# Patient Record
Sex: Male | Born: 1990 | Race: Black or African American | Hispanic: No | Marital: Single | Smoking: Never smoker
Health system: Southern US, Community
[De-identification: ages and names within clinical notes are randomized; demographics above are authoritative.]

## PROBLEM LIST (undated history)

## (undated) ENCOUNTER — Emergency Department (HOSPITAL_COMMUNITY): Admission: EM | Payer: PRIVATE HEALTH INSURANCE | Source: Home / Self Care

## (undated) DIAGNOSIS — M25311 Other instability, right shoulder: Secondary | ICD-10-CM

---
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
## 2013-12-07 DIAGNOSIS — M25311 Other instability, right shoulder: Secondary | ICD-10-CM

## 2013-12-07 HISTORY — DX: Other instability, right shoulder: M25.311

## 2013-12-17 ENCOUNTER — Other Ambulatory Visit: Payer: Self-pay | Admitting: Orthopedic Surgery

## 2013-12-17 ENCOUNTER — Encounter (HOSPITAL_BASED_OUTPATIENT_CLINIC_OR_DEPARTMENT_OTHER): Payer: Self-pay | Admitting: *Deleted

## 2013-12-19 ENCOUNTER — Ambulatory Visit (HOSPITAL_BASED_OUTPATIENT_CLINIC_OR_DEPARTMENT_OTHER)
Admission: RE | Admit: 2013-12-19 | Discharge: 2013-12-19 | Disposition: A | Discharge status: Home / Self Care | Payer: BC Managed Care – PPO | Source: Ambulatory Visit | Attending: Orthopedic Surgery | Admitting: Orthopedic Surgery

## 2013-12-19 ENCOUNTER — Encounter (HOSPITAL_BASED_OUTPATIENT_CLINIC_OR_DEPARTMENT_OTHER): Payer: BC Managed Care – PPO | Admitting: Certified Registered"

## 2013-12-19 ENCOUNTER — Encounter (HOSPITAL_BASED_OUTPATIENT_CLINIC_OR_DEPARTMENT_OTHER): Payer: Self-pay | Admitting: *Deleted

## 2013-12-19 ENCOUNTER — Ambulatory Visit (HOSPITAL_BASED_OUTPATIENT_CLINIC_OR_DEPARTMENT_OTHER): Payer: BC Managed Care – PPO | Admitting: Certified Registered"

## 2013-12-19 ENCOUNTER — Ambulatory Visit (HOSPITAL_COMMUNITY): Payer: BC Managed Care – PPO

## 2013-12-19 ENCOUNTER — Encounter (HOSPITAL_BASED_OUTPATIENT_CLINIC_OR_DEPARTMENT_OTHER)
Admission: RE | Disposition: A | Discharge status: Home / Self Care | Payer: Self-pay | Source: Ambulatory Visit | Attending: Orthopedic Surgery

## 2013-12-19 DIAGNOSIS — M24419 Recurrent dislocation, unspecified shoulder: Secondary | ICD-10-CM | POA: Insufficient documentation

## 2013-12-19 DIAGNOSIS — M25311 Other instability, right shoulder: Secondary | ICD-10-CM

## 2013-12-19 DIAGNOSIS — M24819 Other specific joint derangements of unspecified shoulder, not elsewhere classified: Secondary | ICD-10-CM | POA: Diagnosis present

## 2013-12-19 HISTORY — DX: Other instability, right shoulder: M25.311

## 2013-12-19 LAB — POCT HEMOGLOBIN-HEMACUE: HEMOGLOBIN: 16.3 g/dL (ref 13.0–17.0)

## 2013-12-19 SURGERY — REPAIR, SHOULDER, LATARJET
Anesthesia: General | Site: Shoulder | Laterality: Right

## 2013-12-19 MED ORDER — FENTANYL CITRATE 0.05 MG/ML IJ SOLN
INTRAMUSCULAR | Status: DC | PRN
  Administered 2013-12-19: 50 ug via INTRAVENOUS
  Administered 2013-12-19: 100 ug via INTRAVENOUS

## 2013-12-19 MED ORDER — FENTANYL CITRATE 0.05 MG/ML IJ SOLN
INTRAMUSCULAR | Status: AC
  Filled 2013-12-19: qty 2

## 2013-12-19 MED ORDER — HYDROMORPHONE HCL PF 1 MG/ML IJ SOLN
INTRAMUSCULAR | Status: AC
  Filled 2013-12-19: qty 1

## 2013-12-19 MED ORDER — CEFAZOLIN SODIUM-DEXTROSE 2-3 GM-% IV SOLR
INTRAVENOUS | Status: AC
  Filled 2013-12-19: qty 50

## 2013-12-19 MED ORDER — PROMETHAZINE HCL 25 MG/ML IJ SOLN: 6.2500 mg | INTRAMUSCULAR | Status: DC | PRN

## 2013-12-19 MED ORDER — BUPIVACAINE-EPINEPHRINE (PF) 0.5% -1:200000 IJ SOLN
INTRAMUSCULAR | Status: DC | PRN
  Administered 2013-12-19: 30 mL via PERINEURAL

## 2013-12-19 MED ORDER — MIDAZOLAM HCL 2 MG/ML PO SYRP: 12.0000 mg | ORAL_SOLUTION | Freq: Once | ORAL | Status: DC | PRN

## 2013-12-19 MED ORDER — MIDAZOLAM HCL 5 MG/5ML IJ SOLN
INTRAMUSCULAR | Status: DC | PRN
  Administered 2013-12-19: 2 mg via INTRAVENOUS

## 2013-12-19 MED ORDER — DOCUSATE SODIUM 100 MG PO CAPS: 100.0000 mg | ORAL_CAPSULE | Freq: Three times a day (TID) | ORAL | Status: AC | PRN

## 2013-12-19 MED ORDER — SODIUM CHLORIDE 0.9 % IR SOLN
Status: DC | PRN
  Administered 2013-12-19: 1

## 2013-12-19 MED ORDER — OXYCODONE HCL 5 MG PO TABS
ORAL_TABLET | ORAL | Status: AC
  Filled 2013-12-19: qty 1

## 2013-12-19 MED ORDER — MEPERIDINE HCL 25 MG/ML IJ SOLN: 6.2500 mg | INTRAMUSCULAR | Status: DC | PRN

## 2013-12-19 MED ORDER — POVIDONE-IODINE 7.5 % EX SOLN: Freq: Once | CUTANEOUS | Status: DC

## 2013-12-19 MED ORDER — MIDAZOLAM HCL 2 MG/2ML IJ SOLN
1.0000 mg | INTRAMUSCULAR | Status: DC | PRN
  Administered 2013-12-19: 2 mg via INTRAVENOUS

## 2013-12-19 MED ORDER — LACTATED RINGERS IV SOLN
INTRAVENOUS | Status: DC
  Administered 2013-12-19 (×2): via INTRAVENOUS

## 2013-12-19 MED ORDER — ONDANSETRON HCL 4 MG/2ML IJ SOLN
INTRAMUSCULAR | Status: DC | PRN
  Administered 2013-12-19: 4 mg via INTRAVENOUS

## 2013-12-19 MED ORDER — HYDROMORPHONE HCL PF 1 MG/ML IJ SOLN
0.2500 mg | INTRAMUSCULAR | Status: DC | PRN
  Administered 2013-12-19 (×3): 0.25 mg via INTRAVENOUS

## 2013-12-19 MED ORDER — MIDAZOLAM HCL 2 MG/2ML IJ SOLN: 0.5000 mg | Freq: Once | INTRAMUSCULAR | Status: DC | PRN

## 2013-12-19 MED ORDER — OXYCODONE HCL 5 MG PO TABS
5.0000 mg | ORAL_TABLET | Freq: Once | ORAL | Status: AC | PRN
  Administered 2013-12-19: 5 mg via ORAL

## 2013-12-19 MED ORDER — MIDAZOLAM HCL 2 MG/2ML IJ SOLN
INTRAMUSCULAR | Status: AC
  Filled 2013-12-19: qty 2

## 2013-12-19 MED ORDER — CEFAZOLIN SODIUM-DEXTROSE 2-3 GM-% IV SOLR
2.0000 g | INTRAVENOUS | Status: AC
  Administered 2013-12-19: 2 g via INTRAVENOUS

## 2013-12-19 MED ORDER — FENTANYL CITRATE 0.05 MG/ML IJ SOLN
INTRAMUSCULAR | Status: AC
  Filled 2013-12-19: qty 6

## 2013-12-19 MED ORDER — LIDOCAINE HCL 4 % MT SOLN
OROMUCOSAL | Status: DC | PRN
  Administered 2013-12-19: 4 mL via TOPICAL

## 2013-12-19 MED ORDER — SUCCINYLCHOLINE CHLORIDE 20 MG/ML IJ SOLN
INTRAMUSCULAR | Status: DC | PRN
  Administered 2013-12-19: 100 mg via INTRAVENOUS

## 2013-12-19 MED ORDER — FENTANYL CITRATE 0.05 MG/ML IJ SOLN
50.0000 ug | INTRAMUSCULAR | Status: DC | PRN
  Administered 2013-12-19: 100 ug via INTRAVENOUS

## 2013-12-19 MED ORDER — OXYCODONE HCL 5 MG/5ML PO SOLN: 5.0000 mg | Freq: Once | ORAL | Status: AC | PRN

## 2013-12-19 MED ORDER — DEXAMETHASONE SODIUM PHOSPHATE 4 MG/ML IJ SOLN
INTRAMUSCULAR | Status: DC | PRN
  Administered 2013-12-19: 10 mg via INTRAVENOUS

## 2013-12-19 MED ORDER — OXYCODONE-ACETAMINOPHEN 5-325 MG PO TABS: 1.0000 | ORAL_TABLET | ORAL | Status: AC | PRN

## 2013-12-19 MED ORDER — PROPOFOL 10 MG/ML IV BOLUS
INTRAVENOUS | Status: DC | PRN
  Administered 2013-12-19: 200 mg via INTRAVENOUS

## 2013-12-19 SURGICAL SUPPLY — 113 items
BENZOIN TINCTURE PRP APPL 2/3 (GAUZE/BANDAGES/DRESSINGS) IMPLANT
BIT DRILL 2.75 .066 CANNLTION (DRILL) ×2 IMPLANT
BIT DRILL NON CANNULATED 4MM (DRILL) ×2 IMPLANT
BLADE AVERAGE 25MMX9MM (BLADE)
BLADE AVERAGE 25X9 (BLADE) IMPLANT
BLADE CLIPPER SURG (BLADE) IMPLANT
BLADE SAW SAG 19X10X0.6 (BLADE) ×3 IMPLANT
BLADE SAW SAG 19X10X0.6MM (BLADE) ×1
BLADE SURG 10 STRL SS (BLADE) ×4 IMPLANT
BLADE SURG 15 STRL LF DISP TIS (BLADE) ×2 IMPLANT
BLADE SURG 15 STRL SS (BLADE) ×2
BUR EGG 3PK/BX (BURR) ×4 IMPLANT
BUR EGG ELITE 4.0 (BURR) IMPLANT
BUR EGG ELITE 4.0MM (BURR)
BUR OVAL 4.0 (BURR) IMPLANT
CANISTER SUCT 3000ML (MISCELLANEOUS) IMPLANT
CANNULA 5.75X71 LONG (CANNULA) IMPLANT
CANNULA TWIST IN 8.25X7CM (CANNULA) IMPLANT
CHLORAPREP W/TINT 26ML (MISCELLANEOUS) ×4 IMPLANT
CLOSURE WOUND 1/2 X4 (GAUZE/BANDAGES/DRESSINGS) ×1
DECANTER SPIKE VIAL GLASS SM (MISCELLANEOUS) IMPLANT
DERMABOND ADVANCED (GAUZE/BANDAGES/DRESSINGS)
DERMABOND ADVANCED .7 DNX12 (GAUZE/BANDAGES/DRESSINGS) IMPLANT
DRAPE C-ARM 42X72 X-RAY (DRAPES) IMPLANT
DRAPE INCISE IOBAN 66X45 STRL (DRAPES) ×4 IMPLANT
DRAPE STERI 35X30 U-POUCH (DRAPES) IMPLANT
DRAPE SURG 17X23 STRL (DRAPES) ×8 IMPLANT
DRAPE U 20/CS (DRAPES) ×4 IMPLANT
DRAPE U-SHAPE 47X51 STRL (DRAPES) ×4 IMPLANT
DRAPE U-SHAPE 76X120 STRL (DRAPES) ×8 IMPLANT
DRILL 2.75 .066 CANNULATION (DRILL) ×4
DRILL NON CANNULATED 4MM (DRILL) ×4
DRSG PAD ABDOMINAL 8X10 ST (GAUZE/BANDAGES/DRESSINGS) IMPLANT
DRSG TEGADERM 4X4.75 (GAUZE/BANDAGES/DRESSINGS) ×8 IMPLANT
ELECT BLADE 6.5 .24CM SHAFT (ELECTRODE) ×4 IMPLANT
ELECT NEEDLE TIP 2.8 STRL (NEEDLE) ×4 IMPLANT
ELECT REM PT RETURN 9FT ADLT (ELECTROSURGICAL) ×4
ELECTRODE REM PT RTRN 9FT ADLT (ELECTROSURGICAL) ×2 IMPLANT
GAUZE SPONGE 4X4 12PLY STRL (GAUZE/BANDAGES/DRESSINGS) ×4 IMPLANT
GAUZE SPONGE 4X4 16PLY XRAY LF (GAUZE/BANDAGES/DRESSINGS) IMPLANT
GAUZE XEROFORM 1X8 LF (GAUZE/BANDAGES/DRESSINGS) ×4 IMPLANT
GLOVE BIO SURGEON STRL SZ7 (GLOVE) ×4 IMPLANT
GLOVE BIO SURGEON STRL SZ7.5 (GLOVE) ×4 IMPLANT
GLOVE BIOGEL PI IND STRL 7.0 (GLOVE) ×4 IMPLANT
GLOVE BIOGEL PI IND STRL 8 (GLOVE) ×2 IMPLANT
GLOVE BIOGEL PI INDICATOR 7.0 (GLOVE) ×4
GLOVE BIOGEL PI INDICATOR 8 (GLOVE) ×2
GLOVE ECLIPSE 6.5 STRL STRAW (GLOVE) ×4 IMPLANT
GLOVE EXAM NITRILE MD LF STRL (GLOVE) ×4 IMPLANT
GOWN STRL REUS W/ TWL LRG LVL3 (GOWN DISPOSABLE) ×4 IMPLANT
GOWN STRL REUS W/TWL LRG LVL3 (GOWN DISPOSABLE) ×4
GOWN STRL REUS W/TWL XL LVL4 (GOWN DISPOSABLE) ×4 IMPLANT
GUIDEWIRE .062X7IN LONG (WIRE) ×8 IMPLANT
KIT BIO-TENODESIS 3X8 DISP (MISCELLANEOUS)
KIT INSRT BABSR STRL DISP BTN (MISCELLANEOUS) IMPLANT
MANIFOLD NEPTUNE II (INSTRUMENTS) IMPLANT
NDL SUT 6 .5 CRC .975X.05 MAYO (NEEDLE) IMPLANT
NEEDLE 1/2 CIR CATGUT .05X1.09 (NEEDLE) IMPLANT
NEEDLE MAYO TAPER (NEEDLE)
NEEDLE SCORPION MULTI FIRE (NEEDLE) IMPLANT
NS IRRIG 1000ML POUR BTL (IV SOLUTION) IMPLANT
PACK ARTHROSCOPY DSU (CUSTOM PROCEDURE TRAY) ×4 IMPLANT
PACK BASIN DAY SURGERY FS (CUSTOM PROCEDURE TRAY) ×4 IMPLANT
PASSER SUT SWANSON 36MM LOOP (INSTRUMENTS) IMPLANT
PENCIL BUTTON HOLSTER BLD 10FT (ELECTRODE) ×4 IMPLANT
PIN STEINMAN 7/64X9 TRO PT NS (PIN) ×8 IMPLANT
RESECTOR FULL RADIUS 4.2MM (BLADE) IMPLANT
RETRIEVER SUT HEWSON (MISCELLANEOUS) IMPLANT
SCREW CANNULATED 3.75X36MM FT (Screw) ×8 IMPLANT
SHEET MEDIUM DRAPE 40X70 STRL (DRAPES) IMPLANT
SLEEVE SCD COMPRESS KNEE MED (MISCELLANEOUS) ×4 IMPLANT
SLING ARM IMMOBILIZER LRG (SOFTGOODS) ×4 IMPLANT
SLING ARM IMMOBILIZER MED (SOFTGOODS) IMPLANT
SLING ARM LRG ADULT FOAM STRAP (SOFTGOODS) IMPLANT
SLING ARM MED ADULT FOAM STRAP (SOFTGOODS) IMPLANT
SLING ARM XL FOAM STRAP (SOFTGOODS) IMPLANT
SPONGE LAP 18X18 X RAY DECT (DISPOSABLE) ×4 IMPLANT
SPONGE LAP 4X18 X RAY DECT (DISPOSABLE) ×8 IMPLANT
STRIP CLOSURE SKIN 1/2X4 (GAUZE/BANDAGES/DRESSINGS) ×3 IMPLANT
SUCTION FRAZIER TIP 10 FR DISP (SUCTIONS) ×4 IMPLANT
SUPPORT WRAP ARM LG (MISCELLANEOUS) ×4 IMPLANT
SUT 2 FIBERLOOP 20 STRT BLUE (SUTURE)
SUT BONE WAX W31G (SUTURE) IMPLANT
SUT ETHIBOND 2 OS 4 DA (SUTURE) IMPLANT
SUT ETHILON 3 0 PS 1 (SUTURE) IMPLANT
SUT ETHILON 4 0 PS 2 18 (SUTURE) IMPLANT
SUT FIBERWIRE #2 38 T-5 BLUE (SUTURE)
SUT FIBERWIRE 2-0 18 17.9 3/8 (SUTURE)
SUT MNCRL AB 3-0 PS2 18 (SUTURE) IMPLANT
SUT MNCRL AB 4-0 PS2 18 (SUTURE) ×4 IMPLANT
SUT PDS 2 CP NEEDLE XSPECIAL (SUTURE) IMPLANT
SUT PDS AB 0 CT 36 (SUTURE) IMPLANT
SUT PROLENE 3 0 PS 2 (SUTURE) IMPLANT
SUT TIGER TAPE 7 IN WHITE (SUTURE) IMPLANT
SUT VIC AB 0 CT1 27 (SUTURE)
SUT VIC AB 0 CT1 27XBRD ANBCTR (SUTURE) IMPLANT
SUT VIC AB 1 CT1 27 (SUTURE) ×2
SUT VIC AB 1 CT1 27XBRD ANBCTR (SUTURE) ×2 IMPLANT
SUT VIC AB 2-0 SH 27 (SUTURE) ×2
SUT VIC AB 2-0 SH 27XBRD (SUTURE) ×2 IMPLANT
SUTURE 2 FIBERLOOP 20 STRT BLU (SUTURE) IMPLANT
SUTURE FIBERWR #2 38 T-5 BLUE (SUTURE) IMPLANT
SUTURE FIBERWR 2-0 18 17.9 3/8 (SUTURE) IMPLANT
SYR BULB 3OZ (MISCELLANEOUS) ×4 IMPLANT
TAPE FIBER 2MM 7IN #2 BLUE (SUTURE) IMPLANT
TOWEL OR 17X24 6PK STRL BLUE (TOWEL DISPOSABLE) ×8 IMPLANT
TOWEL OR NON WOVEN STRL DISP B (DISPOSABLE) IMPLANT
TUBE CONNECTING 20'X1/4 (TUBING)
TUBE CONNECTING 20X1/4 (TUBING) IMPLANT
TUBING ARTHROSCOPY IRRIG 16FT (MISCELLANEOUS) IMPLANT
WAND STAR VAC 90 (SURGICAL WAND) IMPLANT
WATER STERILE IRR 1000ML POUR (IV SOLUTION) IMPLANT
YANKAUER SUCT BULB TIP NO VENT (SUCTIONS) ×4 IMPLANT

## 2013-12-19 NOTE — Anesthesia Postprocedure Evaluation (Signed)
  Anesthesia Post-op Note  Patient: Roberto Freeman  Procedure(s) Performed: Procedure(s): Right Shoulder Coracoid Transfer (Right)  Patient Location: PACU  Anesthesia Type:GA combined with regional for post-op pain  Level of Consciousness: awake and alert   Airway and Oxygen Therapy: Patient Spontanous Breathing  Post-op Pain: mild  Post-op Assessment: Post-op Vital signs reviewed  Post-op Vital Signs: stable  Last Vitals:  Filed Vitals:   12/19/13 1737  BP:   Pulse: 73  Temp:   Resp: 16    Complications: No apparent anesthesia complications

## 2013-12-19 NOTE — Transfer of Care (Signed)
Immediate Anesthesia Transfer of Care Note  Patient: Roberto Freeman  Procedure(s) Performed: Procedure(s): Right Shoulder Coracoid Transfer (Right)  Patient Location: PACU  Anesthesia Type:GA combined with regional for post-op pain  Level of Consciousness: sedated and patient cooperative  Airway & Oxygen Therapy: Patient Spontanous Breathing and Patient connected to face mask oxygen  Post-op Assessment: Report given to PACU RN and Post -op Vital signs reviewed and stable  Post vital signs: Reviewed and stable  Complications: No apparent anesthesia complications

## 2013-12-19 NOTE — Op Note (Signed)
Procedure(s): Right Shoulder Coracoid Transfer Procedure Note  Roberto Freeman [name] 23 y.o. 12/19/2013  Procedure(s) and Anesthesia Type:    * Right Shoulder Coracoid Transfer - General  Surgeon(s) and Role:    * Mable ParisJustin William Kerie Badger, MD - Primary   Indications:  23 y.o. 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 He was unable to return to desired level of activity was poor secondary to recurrent dislocations and is indicated for open surgery to try and stabilize his shoulder and prevent recurrent dislocations.   Surgeon: Mable ParisHANDLER,Shanekia Latella WILLIAM   Assistants: Damita Lackanielle Lalibert PA-C [facility](Danielle was present and scrubbed throughout the procedure and was essential in positioning, retraction, exposure, and closure)  Anesthesia: General endotracheal anesthesia with preoperative interscalene block given by the attending anesthesiologist    Procedure Detail  Right Shoulder Coracoid Transfer  Findings: The anterior labral tissue was completely deficient. The coracoid process was transferred to the anterior glenoid using 2 3.75 cannulated screws with excellent fixation.   Estimated Blood Loss:  less than 100 mL         Drains: 1 medium hemovac  Blood Given: none          Specimens: none        Complications:  * No complications entered in OR log *         Disposition: PACU - hemodynamically stable.         Condition: stable    Procedure:   The patient was identified in the preoperative holding area where I personally marked the operative extremity after verifying with the patient and consent. He  was taken to the operating room where He was transferred to the   operative table.  The patient received an interscalene block in   the holding area by the attending anesthesiologist.  General anesthesia was induced   in the operating room without complication.  The patient  did receive IV  Ancef prior to the commencement of the procedure.  The patient was   placed in the beach-chair position with the back raised about 30   degrees.  The nonoperative extremity and head and neck were carefully   positioned and padded protecting against neurovascular compromise.  The   Right upper extremity was then prepped and draped in the standard sterile   fashion.    The appropriate operative time-out was performed with   Anesthesia, the perioperative staff, as well as myself and we all agreed   that the right side was the correct operative site.    An approximately 8 cm incision was made from the palpable coracoid tip distally towards the axilla. Dissection was carried down through subcutaneous tissues to the deltopectoral interval which was identified by the cephalic vein which was taken laterally with the deltoid. The pectoralis major was taken medially. The conjoined tendon was identified and the lateral border was carefully dissected. A retractor was placed over the dorsal aspect of the coracoid. The coracoacromial ligament was taken leaving about a centimeter still attached to the coracoid. The pectoralis minor was then divided medially exposing the medial aspect of the coracoid. A small elevator was used beneath the coracoid to expose this area and then protect the underlying structures while a 90 saw was used to cut the coracoid just anterior to the coracoclavicular ligaments. This provided a graft piece that was approximately 18 mm in length. The undersurface the graft was freed of soft tissue  and then decorticated with a saw. 2 4 mm holes were drilled about 10 mm apart from each other. The arm was then externally rotated and the subscapularis was split at the division of the upper two thirds and lower one third bluntly with curved Mayo scissors. The underlying capsule was identified. Retractor was placed medially on the glenoid neck. A Steinmann pin was placed in the superior  glenoid neck to retract the upper portion of the subscapularis. A blunt retractor was placed between the capsule and inferior subscapularis. The joint was then sharply entered at the joint line and a Fukuda retractor was placed to displace the humeral head lateral and posteriorly. The anterior soft tissue of the glenoid was resected with the Bovie and rongeur. A bur was then used to create a freshened bone surface for healing. A 2.75 mm hole was then drilled about 6 mm medial to the face of the glenoid inferiorly in the cannulated system was used to place a 36 mm 3.75 screw inferiorly. The offset of the graft was felt to be just right was not overhanging laterally. The superior screw was then placed with a 36 mm screw. Again the graft was felt to be in good position without overhanging. There is no penetration screw in the articular surface. A Fukuda retractor was removed and copious irrigation was used. The capsule was closed to the coracoacromial ligament on the graft with #1 Vicryl suture. The subscapularis was allowed to close over the graft and did not require any fixation. Again copious irrigation was used in the skin was closed with 2-0 Vicryl and 4-0 Monocryl Steri-Strips and a light dressing. Fluoroscopic imaging was used with a Grashey and axillary views to ensure appropriate positioning the screws and graft. Screws remain extra-articular the graft appeared to be in excellent position. Fluoroscopic imaging imaging also verified that a screw from one of the retractor instruments which could not initially be found was not in the operative site. Eventually the screw was found on the floor. The patient was allowed to awaken from anesthesia transferred to stretcher and taken to the recovery room in stable condition.       POSTOPERATIVE PLAN:  He will be discharged home today with family and will followup in my office in 10-14 days begin some passive range of motion exercises.

## 2013-12-19 NOTE — H&P (Signed)
Roberto Freeman is an 23 y.o. male.   Chief Complaint: R shoulder recurrent instability HPI: H/o R shoulder arthroscopic stabilization procedure with recurrent instability.  Past Medical History  Diagnosis Date  . 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 Shoulder arthroscopy with bankart repair Right 2011    History reviewed. No pertinent family history. Social History:  reports that he has never smoked. He has never used smokeless tobacco. He reports that he drinks alcohol. He reports that he does not use illicit drugs.  Allergies: No Known Allergies  No prescriptions prior to admission    Results for orders placed during the hospital encounter of 12/19/13 (from the past 48 hour(s))  POCT HEMOGLOBIN-HEMACUE     Status: None   Collection Time    12/19/13 12:24 PM      Result Value Ref Range   Hemoglobin 16.3  13.0 - 17.0 g/dL   No results found.  Review of Systems  All other systems reviewed and are negative.   Blood pressure 123/72, pulse 54, temperature 98.6 F (37 C), temperature source Oral, resp. rate 18, height 5\' 11"  (1.803 m), weight 80.74 kg (178 lb), SpO2 100.00%. Physical Exam  Constitutional: He is oriented to person, place, and time. He appears well-developed and well-nourished.  HENT:  Head: Atraumatic.  Eyes: EOM are normal.  Cardiovascular: Intact distal pulses.   Respiratory: Effort normal.  Musculoskeletal:  R shoulder pos apprehension. NVID.  Neurological: He is alert and oriented to person, place, and time.  Skin: Skin is warm and dry.  Psychiatric: He has a normal mood and affect.     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 Risks / benefits of surgery discussed Consent on chart  NPO for OR Preop antibiotics   Bona Hubbard WILLIAM 12/19/2013, 1:04 PM

## 2013-12-19 NOTE — Anesthesia Procedure Notes (Addendum)
Anesthesia Regional Block:  Interscalene brachial plexus block  Pre-Anesthetic Checklist: ,, timeout performed, Correct Patient, Correct Site, Correct Laterality, Correct Procedure, Correct Position, site marked, Risks and benefits discussed,  Surgical consent,  Pre-op evaluation,  At surgeon's request and post-op pain management  Laterality: Right and Upper  Prep: chloraprep       Needles:  Injection technique: Single-shot  Needle Type: Stimulator Needle - 40     Needle Length: 4cm 4 cm Needle Gauge: 22 and 22 G    Additional Needles:  Procedures: nerve stimulator Interscalene brachial plexus block  Nerve Stimulator or Paresthesia:  Response: forearm twitch, 0.4 mA, 0.1 ms,   Additional Responses:   Narrative:  Start time: 12/19/2013 1:04 PM End time: 12/19/2013 1:10 PM Injection made incrementally with aspirations every 5 mL.  Performed by: Personally  Anesthesiologist: Sandford Craze Jackson, MD  Additional Notes: Pt identified in Holding room.  Monitors applied. Working IV access confirmed. Sterile prep R neck.  #22ga PNS to forearm twitch at 0.7040mA threshold.  30cc 0.5% Bupivacaine with 1:200k epi injected incrementally after negative test dose.  Patient asymptomatic, VSS, no heme aspirated, tolerated well.  Sandford Craze Jackson, MD   Procedure Name: Intubation Date/Time: 12/19/2013 1:45 PM Performed by: Gar GibbonKEETON, Laquanda Bick S Pre-anesthesia Checklist: Patient identified, Emergency Drugs available, Suction available and Patient being monitored Patient Re-evaluated:Patient Re-evaluated prior to inductionOxygen Delivery Method: Circle System Utilized Preoxygenation: Pre-oxygenation with 100% oxygen Intubation Type: IV induction Ventilation: Mask ventilation without difficulty Laryngoscope Size: Mac and 3 Grade View: Grade II Tube type: Oral Tube size: 8.0 mm Number of attempts: 1 Airway Equipment and Method: stylet and oral airway Placement Confirmation: ETT inserted through vocal cords under  direct vision,  positive ETCO2 and breath sounds checked- equal and bilateral Secured at: 22 cm Tube secured with: Tape Dental Injury: Teeth and Oropharynx as per pre-operative assessment

## 2013-12-19 NOTE — Discharge Instructions (Signed)
Discharge Instructions after Open Shoulder Repair  A sling has been provided for you. Remain in your sling at all times. This includes sleeping in your sling.  Use ice on the shoulder intermittently over the first 48 hours after surgery.  Pain medicine has been prescribed for you.  Use your medicine liberally over the first 48 hours, and then you can begin to taper your use. You may take Extra Strength Tylenol or Tylenol only in place of the pain pills. DO NOT take ANY nonsteroidal anti-inflammatory pain medications: Advil, Motrin, Ibuprofen, Aleve, Naproxen or Naprosyn.  You may remove your dressing after two days  You may shower 5 days after surgery. The incisions CANNOT get wet prior to 5 days. Simply allow the water to wash over the site and then pat dry. Do not rub the incisions. Make sure your axilla (armpit) is completely dry after showering.  Take one aspirin, a day for 2 weeks after surgery, unless you have an aspirin sensitivity/ allergy or asthma.   Please call [phone] during normal business hours or [phone] after hours for any problems. Including the following:  - excessive redness of the incisions - drainage for more than 4 days - fever of more than 101.5 F  *Please note that pain medications will not be refilled after hours or on weekends.  Regional Anesthesia Blocks  1. Numbness or the inability to move the "blocked" extremity may last from 3-48 hours after placement. The length of time depends on the medication injected and your individual response to the medication. If the numbness is not going away after 48 hours, call your surgeon.  2. The extremity that is blocked will need to be protected until the numbness is gone and the  Strength has returned. Because you cannot feel it, you will need to take extra care to avoid injury. Because it may be weak, you may have difficulty moving it or using it. You may not know what position it is in without looking at it while the  block is in effect.  3. For blocks in the legs and feet, returning to weight bearing and walking needs to be done carefully. You will need to wait until the numbness is entirely gone and the strength has returned. You should be able to move your leg and foot normally before you try and bear weight or walk. You will need someone to be with you when you first try to ensure you do not fall and possibly risk injury.  4. Bruising and tenderness at the needle site are common side effects and will resolve in a few days.  5. Persistent numbness or new problems with movement should be communicated to the surgeon or the [facility] [phone])/ [facility] [phone]).   Post Anesthesia Home Care Instructions  Activity: Get plenty of rest for the remainder of the day. A responsible adult should stay with you for 24 hours following the procedure.  For the next 24 hours, DO NOT: -Drive a car -Advertising copywriterperate machinery -Drink alcoholic beverages -Take any medication unless instructed by your physician -Make any legal decisions or sign important papers.  Meals: Start with liquid foods such as gelatin or soup. Progress to regular foods as tolerated. Avoid greasy, spicy, heavy foods. If nausea and/or vomiting occur, drink only clear liquids until the nausea and/or vomiting subsides. Call your physician if vomiting continues.  Special Instructions/Symptoms: Your throat may feel dry or sore from the anesthesia or the breathing tube placed in  your throat during surgery. If this causes discomfort, gargle with warm salt water. The discomfort should disappear within 24 hours. ° °

## 2013-12-19 NOTE — Anesthesia Preprocedure Evaluation (Addendum)
Anesthesia Evaluation  Patient identified by MRN, date of birth, ID band Patient awake    Reviewed: Allergy & Precautions, H&P , NPO status , Patient's Chart, lab work & pertinent test results  History of Anesthesia Complications Negative for: history of anesthetic complications  Airway Mallampati: I TM Distance: >3 FB Neck ROM: Full    Dental  (+) Teeth Intact, Dental Advisory Given   Pulmonary neg pulmonary ROS,  breath sounds clear to auscultation  Pulmonary exam normal       Cardiovascular negative cardio ROS  Rhythm:Regular Rate:Normal     Neuro/Psych negative neurological ROS     GI/Hepatic negative GI ROS, Neg liver ROS,   Endo/Other    Renal/GU negative Renal ROS     Musculoskeletal   Abdominal   Peds  Hematology negative hematology ROS (+)   Anesthesia Other Findings   Reproductive/Obstetrics                           Anesthesia Physical Anesthesia Plan  ASA: I  Anesthesia Plan: General   Post-op Pain Management:    Induction: Intravenous  Airway Management Planned: Oral ETT  Additional Equipment:   Intra-op Plan:   Post-operative Plan: Extubation in OR  Informed Consent: I have reviewed the patients History and Physical, chart, labs and discussed the procedure including the risks, benefits and alternatives for the proposed anesthesia with the patient or authorized representative who has indicated his/her understanding and acceptance.   Dental advisory given  Plan Discussed with: CRNA and Surgeon  Anesthesia Plan Comments: (Plan routine monitors, GETA with interscalene block for post op analgesia)        Anesthesia Quick Evaluation

## 2013-12-19 NOTE — Progress Notes (Signed)
Assisted Dr. Jackson with right, interscalene  block. Side rails up, monitors on throughout procedure. See vital signs in flow sheet. Tolerated Procedure well. 

## 2015-08-20 ENCOUNTER — Encounter (HOSPITAL_COMMUNITY): Payer: Self-pay | Admitting: *Deleted

## 2015-08-20 ENCOUNTER — Ambulatory Visit (HOSPITAL_COMMUNITY)
Admission: EM | Admit: 2015-08-20 | Discharge: 2015-08-20 | Disposition: A | Discharge status: Home / Self Care | Payer: PRIVATE HEALTH INSURANCE | Attending: Family Medicine | Admitting: Family Medicine

## 2015-08-20 ENCOUNTER — Ambulatory Visit (INDEPENDENT_AMBULATORY_CARE_PROVIDER_SITE_OTHER): Payer: PRIVATE HEALTH INSURANCE

## 2015-08-20 DIAGNOSIS — IMO0001 Reserved for inherently not codable concepts without codable children: Secondary | ICD-10-CM

## 2015-08-20 DIAGNOSIS — S40021A Contusion of right upper arm, initial encounter: Secondary | ICD-10-CM | POA: Diagnosis not present

## 2015-08-20 DIAGNOSIS — S40011A Contusion of right shoulder, initial encounter: Secondary | ICD-10-CM

## 2015-08-20 MED ORDER — DICLOFENAC POTASSIUM 50 MG PO TABS: 50.0000 mg | ORAL_TABLET | Freq: Three times a day (TID) | ORAL | Status: AC

## 2015-08-20 NOTE — ED Notes (Signed)
Large  Arm  Sling  Applied  To  l  shoulder

## 2015-08-20 NOTE — Discharge Instructions (Signed)
Wear sling and use ice and medicine as needed. See your orthopedist for recheck.

## 2015-08-20 NOTE — ED Provider Notes (Signed)
CSN: [id number]     Arrival date & time 08/20/15  1634 History   None    Chief Complaint  Patient presents with  . Shoulder Injury   (Consider location/radiation/quality/duration/timing/severity/associated sxs/prior Treatment) Patient is a 25 y.o. male presenting with shoulder injury. The history is provided by the patient.  Shoulder Injury This is a new problem. The current episode started more than 2 days ago (injured striking another player with shoulder to pads during football game. h/o RCS x2.). The problem has been gradually worsening.    Past Medical History  Diagnosis Date  . 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 Shoulder arthroscopy with bankart repair Right 2011   History reviewed. No pertinent family history. Social History  Substance Use Topics  . Smoking status: Never Smoker   . Smokeless tobacco: Never Used  . Alcohol Use: Yes     Comment: occasionally    Review of Systems  Constitutional: Negative.   Musculoskeletal: Positive for joint swelling. Negative for myalgias, back pain, gait problem and neck pain.  Skin: Negative.   All other systems reviewed and are negative.   Allergies  Review of patient's allergies indicates no known allergies.  Home Medications   Prior to Admission medications   Medication Sig Start Date End Date Taking? Authorizing Provider  docusate sodium (COLACE) 100 MG capsule Take 1 capsule (100 mg total) by mouth 3 (three) times daily as needed. 12/19/13   Jiles Haroldanielle Laliberte, PA-C  oxyCODONE-acetaminophen (ROXICET) 5-325 MG per tablet Take 1-2 tablets by mouth every 4 (four) hours as needed for severe pain. 12/19/13   Jiles Haroldanielle Laliberte, PA-C   Meds Ordered and Administered this Visit  Medications - No data to display  BP 126/72 mmHg  Pulse 78  Temp(Src) 98.6 F (37 C) (Oral)  Resp 18  SpO2 100% No data found.   Physical Exam  Constitutional: He is oriented to  person, place, and time. He appears well-developed and well-nourished.  Musculoskeletal: He exhibits tenderness.       Right shoulder: He exhibits decreased range of motion, tenderness, swelling, pain and decreased strength. He exhibits no effusion, no crepitus and normal pulse.  Neurological: He is alert and oriented to person, place, and time.  Skin: Skin is warm and dry.  Nursing note and vitals reviewed.   ED Course  Procedures (including critical care time)  Labs Review Labs Reviewed - No data to display  Imaging Review No results found. X-rays reviewed and report per radiologist.   Visual Acuity Review  Right Eye Distance:   Left Eye Distance:   Bilateral Distance:    Right Eye Near:   Left Eye Near:    Bilateral Near:         MDM  No diagnosis found. Meds ordered this encounter  Medications  . diclofenac (CATAFLAM) 50 MG tablet    Sig: Take 1 tablet (50 mg total) by mouth 3 (three) times daily.    Dispense:  30 tablet    Refill:  0       Linna HoffJames D Kindl, MD 08/20/15 2040

## 2015-08-20 NOTE — ED Notes (Signed)
Pt  Reports  Pain  r shoulder   He  Reports  He  Injured  The  Shoulder     While  Playing   Football  He   Reports  He  Has  Injured  The  Shoulder  In the  Past  Playing  Football he  Has  Pain on  rom     Pt   Has      Pain    On  Movement  And  posistion         He  Has  Had surgery on that  Shoulder  Ibn past

## 2017-07-27 IMAGING — DX DG SHOULDER 2+V*R*
4 series · 4 of 4 positions shown · non-contrast
Comparison: None.

CLINICAL DATA: Sprained right AC joint, with persistent right
shoulder pain. Initial encounter.

EXAM:
RIGHT SHOULDER - 2+ VIEW

[shoulder ap (1 of 2)]
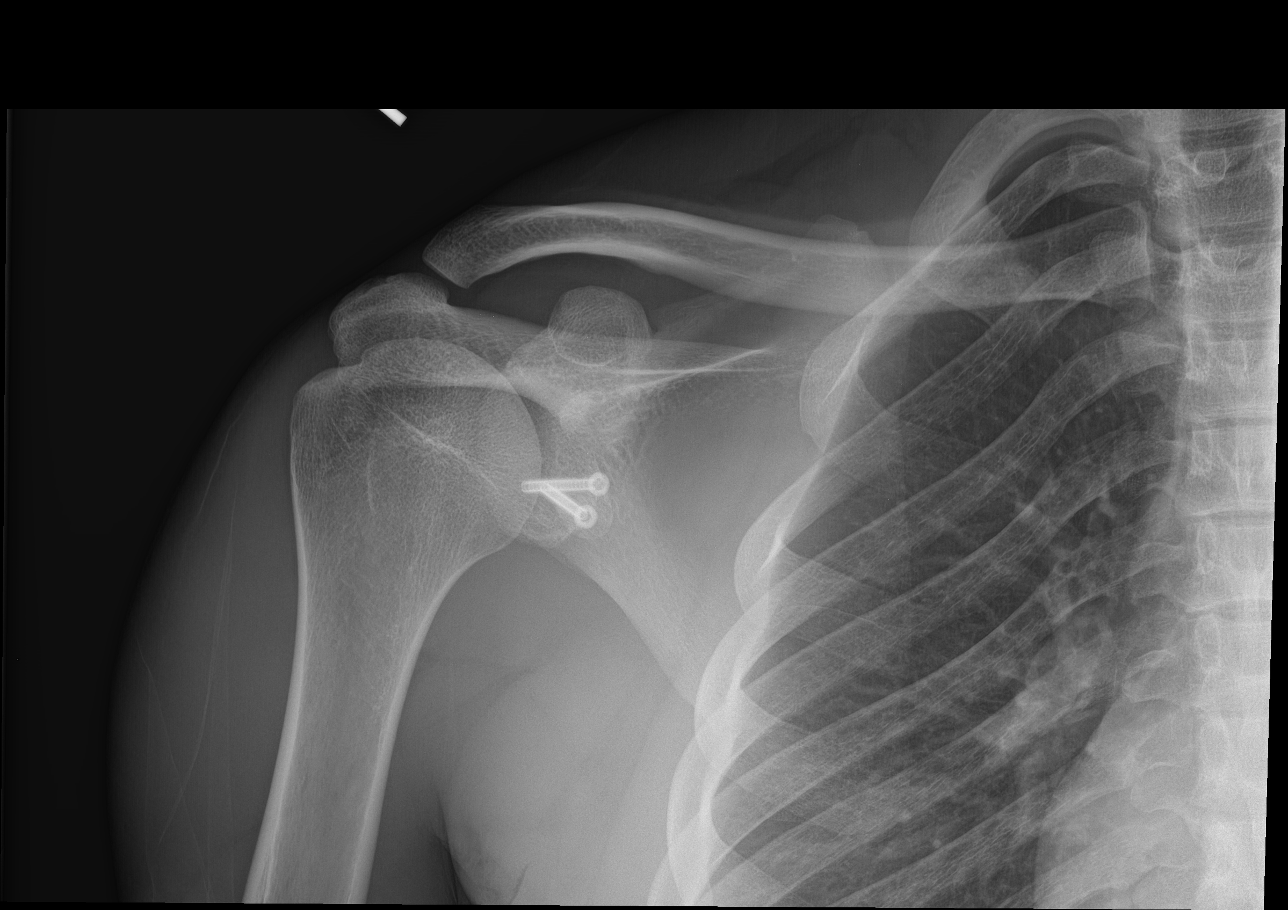

[shoulder ap (2 of 2)]
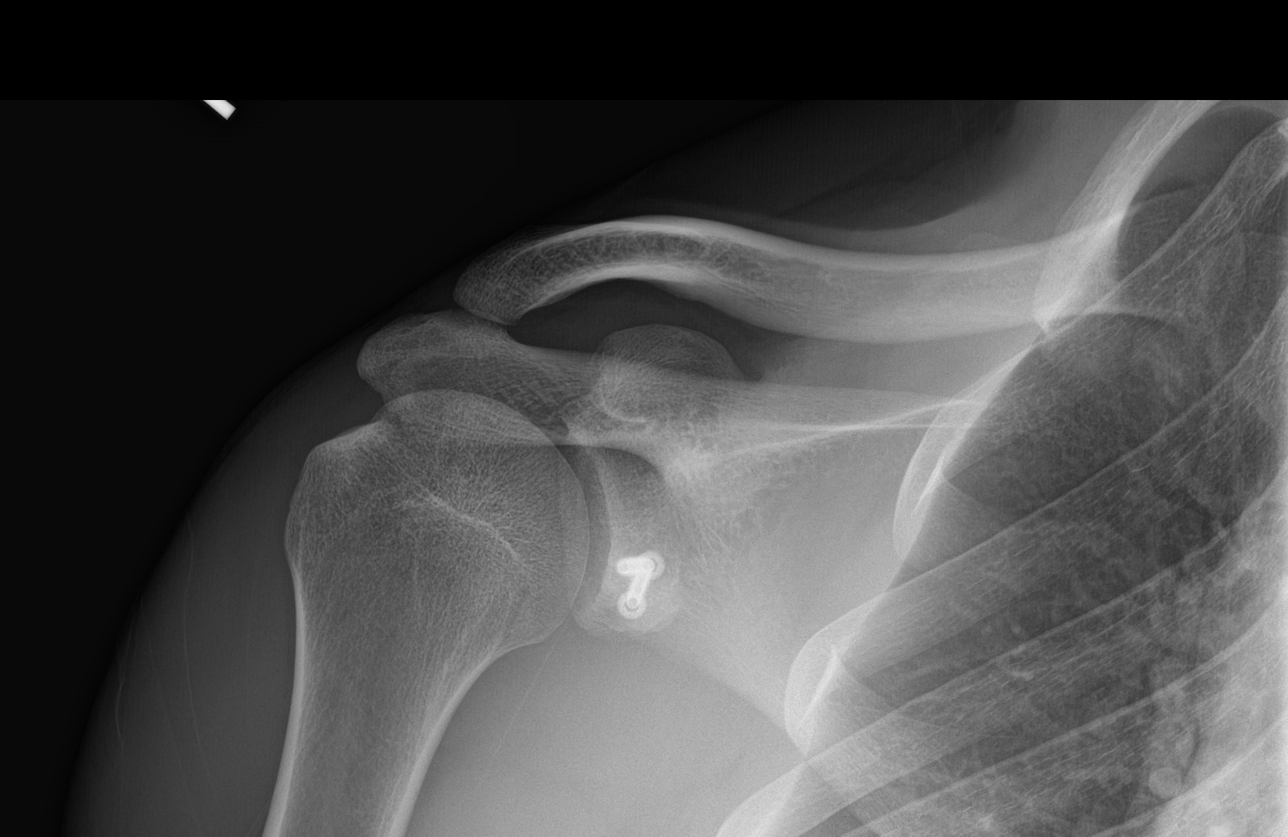

[shoulder y view]
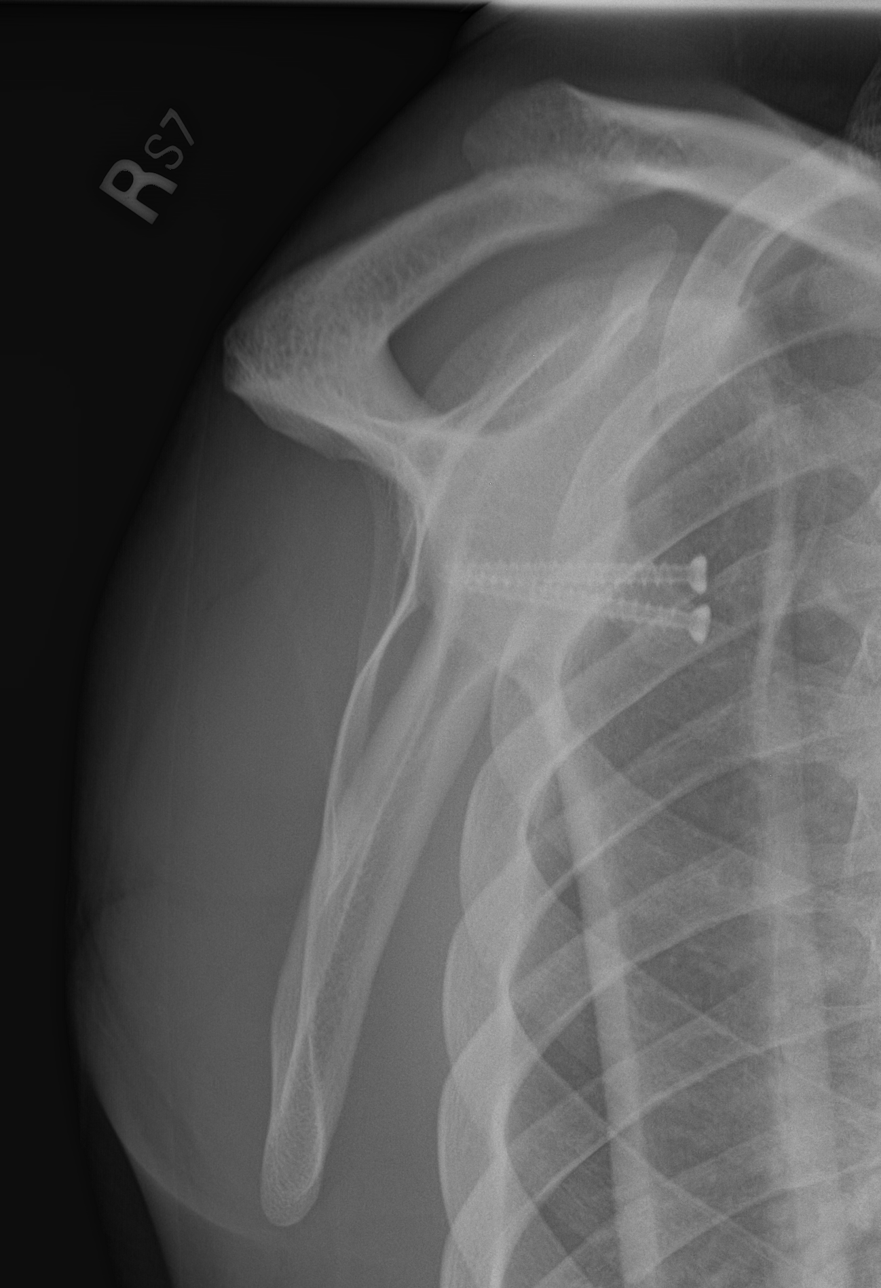

[shoulder axillary]
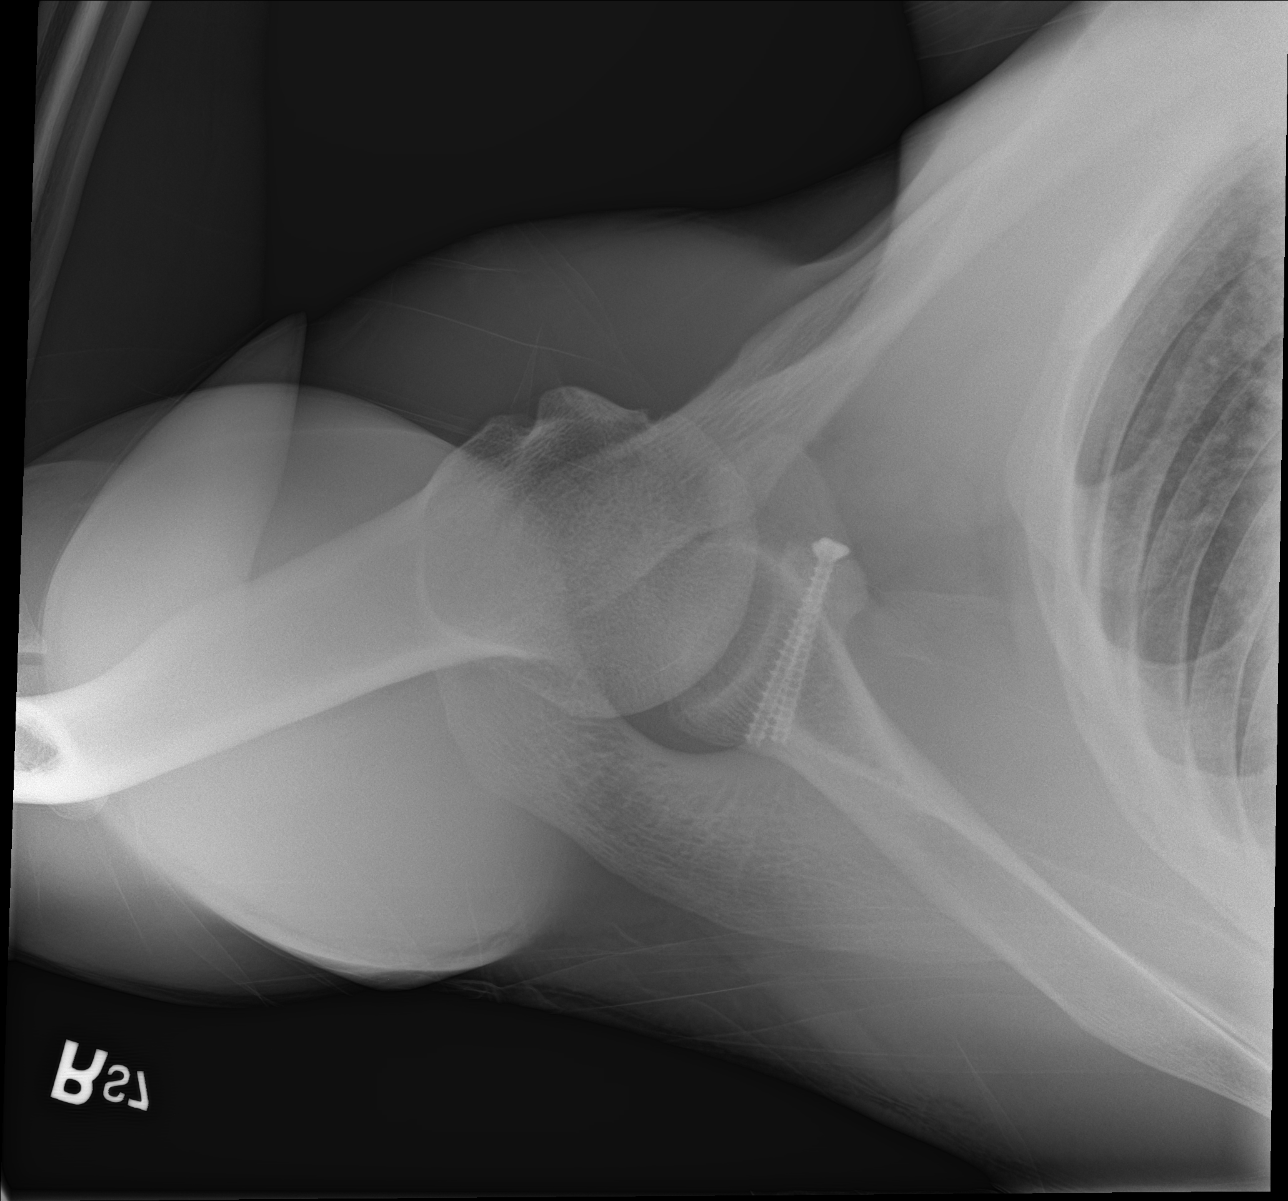

[4 of 4 positions shown; findings below may reference images not displayed]

FINDINGS: There is no evidence of fracture or dislocation. Screws are noted at
the inferior right glenoid. The right humeral head is seated within
the glenoid fossa. The acromioclavicular joint is unremarkable in
appearance. No significant soft tissue abnormalities are seen. The
visualized portions of the right lung are clear.
IMPRESSION: No evidence of fracture or dislocation. The right acromioclavicular
joint is unremarkable in appearance.

Note that [place] Jumper grade 1 acromioclavicular joint injury may not
be visible on radiograph. Would correlate with the patient's
symptoms.
